# Patient Record
Sex: Female | Born: 1990 | Race: Black or African American | Hispanic: No | Marital: Married | Smoking: Never smoker
Health system: Southern US, Community
[De-identification: ages and names within clinical notes are randomized; demographics above are authoritative.]

## PROBLEM LIST (undated history)

## (undated) ENCOUNTER — Inpatient Hospital Stay (HOSPITAL_COMMUNITY): Payer: Self-pay

## (undated) DIAGNOSIS — D649 Anemia, unspecified: Secondary | ICD-10-CM

## (undated) DIAGNOSIS — I341 Nonrheumatic mitral (valve) prolapse: Secondary | ICD-10-CM

## (undated) DIAGNOSIS — J45909 Unspecified asthma, uncomplicated: Secondary | ICD-10-CM

## (undated) HISTORY — PX: NO PAST SURGERIES: SHX2092

---

## 2015-04-13 ENCOUNTER — Emergency Department (HOSPITAL_COMMUNITY)
Admission: EM | Admit: 2015-04-13 | Discharge: 2015-04-13 | Disposition: A | Discharge status: Home / Self Care | Payer: Managed Care, Other (non HMO) | Attending: Emergency Medicine | Admitting: Emergency Medicine

## 2015-04-13 ENCOUNTER — Emergency Department (HOSPITAL_COMMUNITY): Payer: Managed Care, Other (non HMO)

## 2015-04-13 ENCOUNTER — Encounter (HOSPITAL_COMMUNITY): Payer: Self-pay

## 2015-04-13 DIAGNOSIS — N8320 Unspecified ovarian cysts: Secondary | ICD-10-CM | POA: Diagnosis not present

## 2015-04-13 DIAGNOSIS — Z8679 Personal history of other diseases of the circulatory system: Secondary | ICD-10-CM | POA: Insufficient documentation

## 2015-04-13 DIAGNOSIS — R1030 Lower abdominal pain, unspecified: Secondary | ICD-10-CM | POA: Diagnosis present

## 2015-04-13 DIAGNOSIS — Z862 Personal history of diseases of the blood and blood-forming organs and certain disorders involving the immune mechanism: Secondary | ICD-10-CM | POA: Insufficient documentation

## 2015-04-13 DIAGNOSIS — Z3202 Encounter for pregnancy test, result negative: Secondary | ICD-10-CM | POA: Diagnosis not present

## 2015-04-13 DIAGNOSIS — R109 Unspecified abdominal pain: Secondary | ICD-10-CM

## 2015-04-13 DIAGNOSIS — N83202 Unspecified ovarian cyst, left side: Secondary | ICD-10-CM

## 2015-04-13 HISTORY — DX: Nonrheumatic mitral (valve) prolapse: I34.1

## 2015-04-13 HISTORY — DX: Anemia, unspecified: D64.9

## 2015-04-13 LAB — URINALYSIS, ROUTINE W REFLEX MICROSCOPIC
Bilirubin Urine: NEGATIVE
Glucose, UA: NEGATIVE mg/dL
Hgb urine dipstick: NEGATIVE
Ketones, ur: NEGATIVE mg/dL
Leukocytes, UA: NEGATIVE
Nitrite: NEGATIVE
Protein, ur: NEGATIVE mg/dL
Specific Gravity, Urine: 1.024 (ref 1.005–1.030)
Urobilinogen, UA: 1 mg/dL (ref 0.0–1.0)
pH: 6.5 (ref 5.0–8.0)

## 2015-04-13 LAB — WET PREP, GENITAL
Clue Cells Wet Prep HPF POC: NONE SEEN
Trich, Wet Prep: NONE SEEN
Yeast Wet Prep HPF POC: NONE SEEN

## 2015-04-13 LAB — POC URINE PREG, ED: Preg Test, Ur: NEGATIVE

## 2015-04-13 MED ORDER — OXYCODONE-ACETAMINOPHEN 5-325 MG PO TABS: 1.0000 | ORAL_TABLET | ORAL | Status: DC | PRN

## 2015-04-13 MED ORDER — ONDANSETRON HCL 4 MG PO TABS: 4.0000 mg | ORAL_TABLET | Freq: Four times a day (QID) | ORAL | Status: DC

## 2015-04-13 MED ORDER — OXYCODONE-ACETAMINOPHEN 5-325 MG PO TABS
1.0000 | ORAL_TABLET | Freq: Once | ORAL | Status: AC
Start: 2015-04-13 — End: 2015-04-13
  Administered 2015-04-13: 1 via ORAL
  Filled 2015-04-13: qty 1

## 2015-04-13 MED ORDER — ONDANSETRON 4 MG PO TBDP
4.0000 mg | ORAL_TABLET | Freq: Once | ORAL | Status: AC
  Administered 2015-04-13: 4 mg via ORAL
  Filled 2015-04-13: qty 1

## 2015-04-13 NOTE — ED Provider Notes (Signed)
Complains of lower abdominal pain left-sided greater than right gradual onset 7 days ago. She states she passed tissue per vagina 3 days ago. She's had some vaginal spotting for several days. Last noticed spotting today. No fever. Vomited 32 days ago, admits to slight nausea presently. Denies fever denies urinary symptoms on exam no distress nontoxic alert abdomen nondistended normal active bowel sounds tender over suprapubic area and bilateral lower quadrants left greater than right, no guarding no rigidity no rebound  Doug SouSam Amarii Amy, MD 04/13/15 1557

## 2015-04-13 NOTE — ED Notes (Signed)
Pt ambulating independently w/ steady gait on d/c in no acute distress, A&Ox4. D/c instructions reviewed w/ pt and family - pt and family deny any further questions or concerns at present. Rx given x2  

## 2015-04-13 NOTE — ED Notes (Signed)
[place]will get vitals upon return

## 2015-04-13 NOTE — Discharge Instructions (Signed)
Please monitor for new or worsening signs or symptoms, please return immediately if any present. Please follow-up with OB/GYN for further evaluation of IUD, and ovarian cyst.

## 2015-04-13 NOTE — ED Provider Notes (Signed)
CSN: [id number]     Arrival date & time 04/13/15  1422 History   None    Chief Complaint  Patient presents with  . Abdominal Pain  . Emesis    HPI   24 year old [id number] female presents today with lower abdominal pain. Patient reports that her last normal menstrual cycle started on 04/03/2015 and continue to 04/07/2015. She reports that this was very light and was not consistent throughout her normal cycle, it did start at the normal time. Patient reports that she's had an IUD for the past 3 years and reports that it is coming close to being expired. She reports that 7 days ago (04/09/15) she started experiencing bilateral lower quadrant pain more severe on the left. She reports the following day she passed some tissue that did not look like a blood clot. She reports she has not had any bleeding since the 23rd, with continued pain. She reports that she has baseline pain, worsens different times, not made worse by any movement or activity, slightly improved with ibuprofen therapy. Patient reports she is sexually active, using the IUD for protection, no history of STDs, denies vaginal discharge, burning, urinary symptoms, bowel symptoms. She denies headache, upper respiratory symptoms, shortness of breath, upper abdominal pain, lower extremity swelling or edema. Patient reports she recently moved to the area from [place], does not have an OB/GYN in the area.    Past Medical History  Diagnosis Date  . Mitral valve prolapse syndrome   . Anemia    History reviewed. No pertinent past surgical history. History reviewed. No pertinent family history. History  Substance Use Topics  . Smoking status: Never Smoker   . Smokeless tobacco: Never Used  . Alcohol Use: Yes     Comment: occasionally   OB History    No data available     Review of Systems  All other systems reviewed and are negative.   Allergies  Review of patient's allergies indicates no known allergies.  Home Medications    Prior to Admission medications   Medication Sig Start Date End Date Taking? Authorizing Provider  ibuprofen (ADVIL,MOTRIN) 600 MG tablet Take 600 mg by mouth every 6 (six) hours as needed for headache, mild pain or moderate pain.   Yes Historical Provider, MD  ondansetron (ZOFRAN) 4 MG tablet Take 1 tablet (4 mg total) by mouth every 6 (six) hours. 04/13/15   Eyvonne Mechanic, PA-C  oxyCODONE-acetaminophen (PERCOCET/ROXICET) 5-325 MG per tablet Take 1 tablet by mouth every 4 (four) hours as needed for severe pain. 04/13/15   Pocahontas Cohenour, PA-C   BP 103/64 mmHg  Pulse 77  Temp(Src) 97.7 F (36.5 C) (Oral)  Resp 16  Ht  (1.702 m)  Wt 127 lb (57.607 kg)  BMI 19.89 kg/m2  SpO2 100%  LMP 04/07/2015 Physical Exam  Constitutional: She is oriented to person, place, and time. She appears well-developed and well-nourished.  HENT:  Head: Normocephalic and atraumatic.  Eyes: Pupils are equal, round, and reactive to light.  Neck: Normal range of motion. Neck supple. No JVD present. No tracheal deviation present. No thyromegaly present.  Cardiovascular: Normal rate, regular rhythm, normal heart sounds and intact distal pulses.  Exam reveals no gallop and no friction rub.   No murmur heard. History of mitral regurg, none heard on exam  Pulmonary/Chest: Effort normal and breath sounds normal. No stridor. No respiratory distress. She has no wheezes. She has no rales. She exhibits no tenderness.  Abdominal: Soft. Bowel sounds are normal.  There is no hepatosplenomegaly. There is tenderness in the right lower quadrant and left lower quadrant. There is no rebound and no CVA tenderness.  Musculoskeletal: Normal range of motion.  Lymphadenopathy:    She has no cervical adenopathy.  Neurological: She is alert and oriented to person, place, and time. Coordination normal.  Skin: Skin is warm and dry.  Psychiatric: She has a normal mood and affect. Her behavior is normal. Judgment and thought content  normal.  Nursing note and vitals reviewed.   ED Course  Procedures (including critical care time) Labs Review Labs Reviewed  WET PREP, GENITAL - Abnormal; Notable for the following:    WBC, Wet Prep HPF POC FEW (*)    All other components within normal limits  URINALYSIS, ROUTINE W REFLEX MICROSCOPIC (NOT AT [facility])  RPR  HIV ANTIBODY (ROUTINE TESTING)  POC URINE PREG, ED  GC/CHLAMYDIA PROBE AMP ([place]) NOT AT [facility]    Imaging Review US Transvaginal Non-ob  04/13/2015   CLINICAL DATA:  Acute onset of pelvic pain, worse on the left. Initial encounter.  EXAM: TRANSABDOMINAL AND TRANSVAGINAL ULTRASOUND OF PELVIS  TECHNIQUE: Both transabdominal and transvaginal ultrasound examinations of the pelvis were performed. Transabdominal technique was performed for global imaging of the pelvis including uterus, ovaries, adnexal regions, and pelvic cul-de-sac. It was necessary to proceed with endovaginal exam following the transabdominal exam to visualize the endometrium and ovaries in greater detail.  COMPARISON:  None  FINDINGS: Uterus  Measurements: 6.5 x 3.0 x 4.1 cm. No fibroids or other mass visualized.  Endometrium  Thickness: 0.3 cm. The patient's intrauterine device is noted in expected position.  Right ovary  Measurements: 3.2 x 1.6 x 2.1 cm. Normal appearance/no adnexal mass.  Left ovary  Measurements: 3.3 x 2.2 x 2.9 cm. A somewhat complex 1.8 cm cyst is noted at the left ovary, with associated septations.  Other findings  Trace free fluid is seen within the pelvic cul-de-sac.  IMPRESSION: 1. Uterus unremarkable in appearance. Intrauterine device seen in expected position. 2. Somewhat complex 1.8 cm left ovarian cyst, with associated septations. Given its size and the patient's age, this is likely physiologic, though if the patient's symptoms persist, it could reflect a small hemorrhagic cyst. No evidence for ovarian torsion.   Electronically Signed   By: Roanna Raider M.D.   On: 04/13/2015  18:25   US Pelvis Complete  04/13/2015   CLINICAL DATA:  Acute onset of pelvic pain, worse on the left. Initial encounter.  EXAM: TRANSABDOMINAL AND TRANSVAGINAL ULTRASOUND OF PELVIS  TECHNIQUE: Both transabdominal and transvaginal ultrasound examinations of the pelvis were performed. Transabdominal technique was performed for global imaging of the pelvis including uterus, ovaries, adnexal regions, and pelvic cul-de-sac. It was necessary to proceed with endovaginal exam following the transabdominal exam to visualize the endometrium and ovaries in greater detail.  COMPARISON:  None  FINDINGS: Uterus  Measurements: 6.5 x 3.0 x 4.1 cm. No fibroids or other mass visualized.  Endometrium  Thickness: 0.3 cm. The patient's intrauterine device is noted in expected position.  Right ovary  Measurements: 3.2 x 1.6 x 2.1 cm. Normal appearance/no adnexal mass.  Left ovary  Measurements: 3.3 x 2.2 x 2.9 cm. A somewhat complex 1.8 cm cyst is noted at the left ovary, with associated septations.  Other findings  Trace free fluid is seen within the pelvic cul-de-sac.  IMPRESSION: 1. Uterus unremarkable in appearance. Intrauterine device seen in expected position. 2. Somewhat complex 1.8 cm left ovarian cyst, with  associated septations. Given its size and the patient's age, this is likely physiologic, though if the patient's symptoms persist, it could reflect a small hemorrhagic cyst. No evidence for ovarian torsion.   Electronically Signed   By: Roanna RaiderJeffery  Chang M.D.   On: 04/13/2015 18:25     EKG Interpretation None      MDM   Final diagnoses:  Cyst of left ovary    Labs: Urine, wet prep, urine pregnant, RPR, HIV- no significant findings  Imaging: US Pelvis Complete   Consults: none  Therapeutics: Percocet, Zofran  Assessment: Left ovarian cyst  Plan: Pt presents with LLQ pain and vaginal spotting. Negative preg test today, no bleeding noted during pelvic exam. IUD in expected position. Pt was found to have  ovarian cyst on US, no signs of torsion or abscess. Unlikely to be GI related.  Pt's pain managed here, discharged home with adequate pain control, instructed to follow up with OBGYN for further evaluation and management. Pt given strict return precautions in the event new or worsening s/s present. She verbalized her understanding and agreement to today's plan.  Eyvonne MechanicJeffrey Camillia Marcy, PA-C 04/15/15 1238  Doug SouSam Jacubowitz, MD 04/15/15 [phone]

## 2015-04-13 NOTE — ED Notes (Signed)
Patient c/o bilateral lower abdominal pain x 4 days. Patieant denies any vaginal discharge or dysuria.

## 2015-04-14 LAB — RPR: RPR Ser Ql: NONREACTIVE

## 2015-04-14 LAB — HIV ANTIBODY (ROUTINE TESTING W REFLEX): HIV Screen 4th Generation wRfx: NONREACTIVE

## 2015-04-15 LAB — GC/CHLAMYDIA PROBE AMP (~~LOC~~) NOT AT ARMC
Chlamydia: NEGATIVE
Neisseria Gonorrhea: NEGATIVE

## 2015-12-13 IMAGING — US US TRANSVAGINAL NON-OB
1 series · 13 of 25 positions shown · non-contrast
Comparison: None

CLINICAL DATA: Acute onset of pelvic pain, worse on the left.
Initial encounter.

EXAM:
TRANSABDOMINAL AND TRANSVAGINAL ULTRASOUND OF PELVIS
TECHNIQUE: Both transabdominal and transvaginal ultrasound examinations of the
pelvis were performed. Transabdominal technique was performed for
global imaging of the pelvis including uterus, ovaries, adnexal
regions, and pelvic cul-de-sac. It was necessary to proceed with
endovaginal exam following the transabdominal exam to visualize the
endometrium and ovaries in greater detail.

[Series 1: us transvaginal non-ob · 0.20mm/px · 52 acquisitions, 13 frames shown]
[im 1/52]
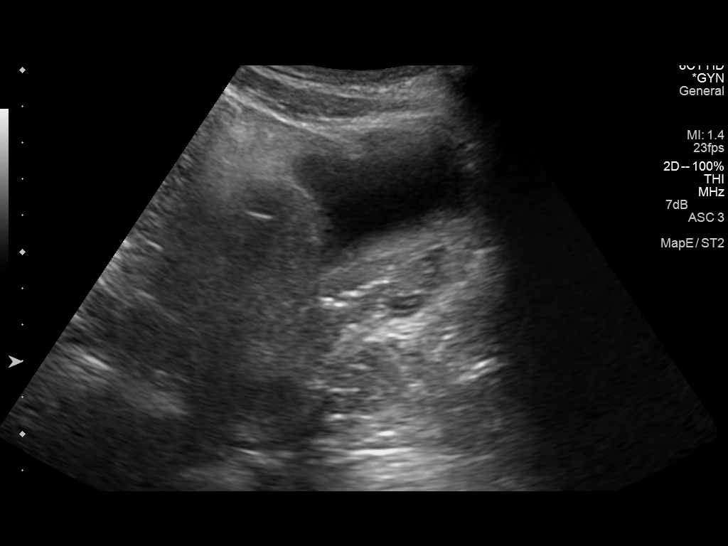
[im 5/52]
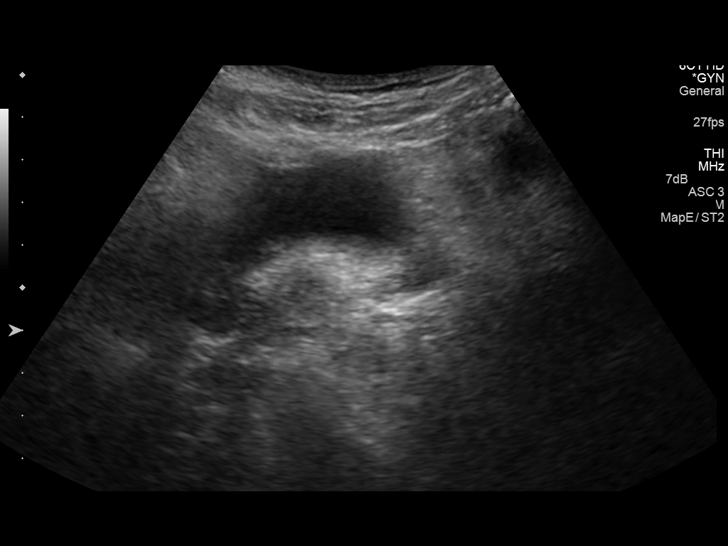
[im 9/52]
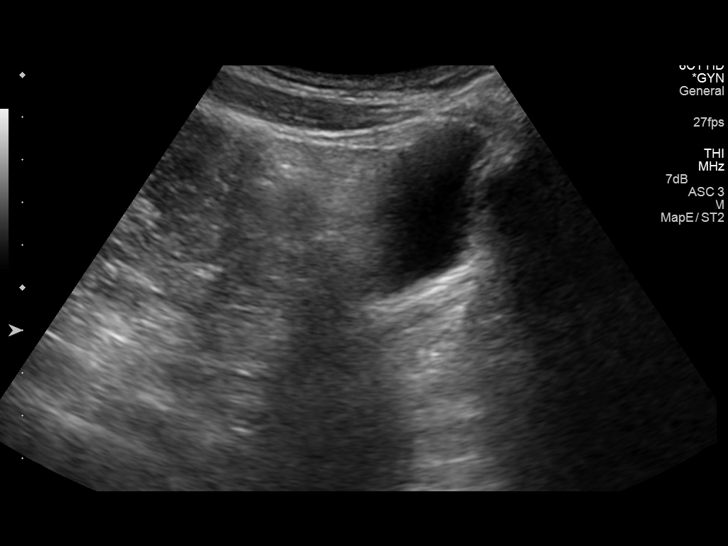
[im 13/52]
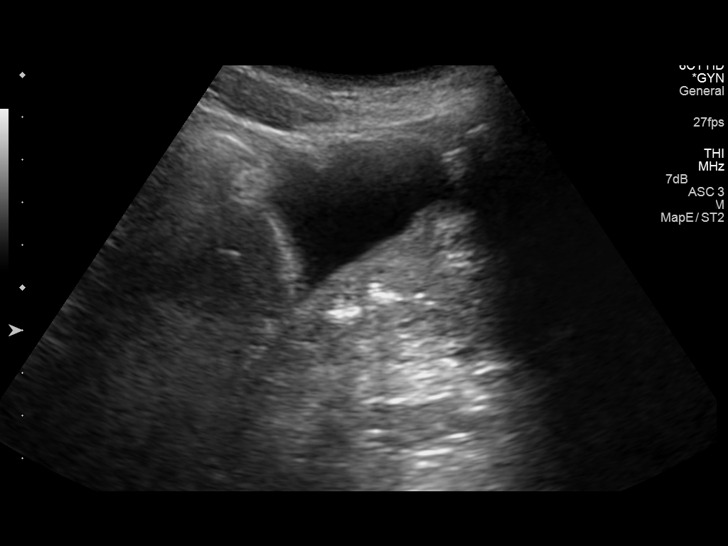
[im 18/52]
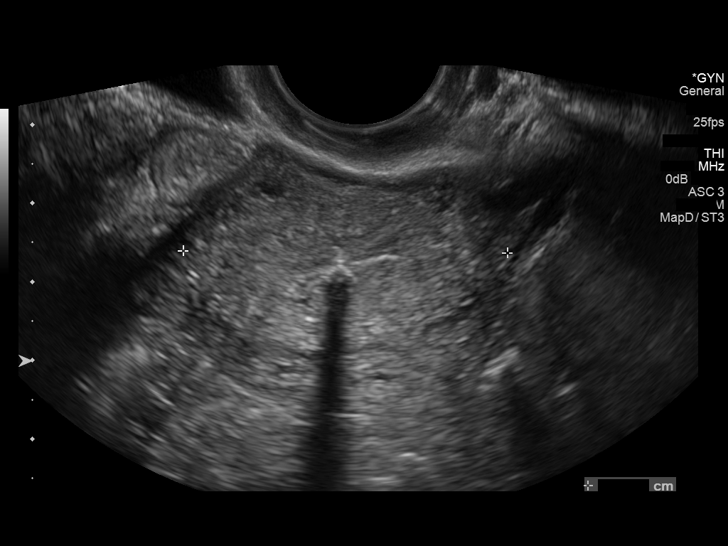
[im 22/52]
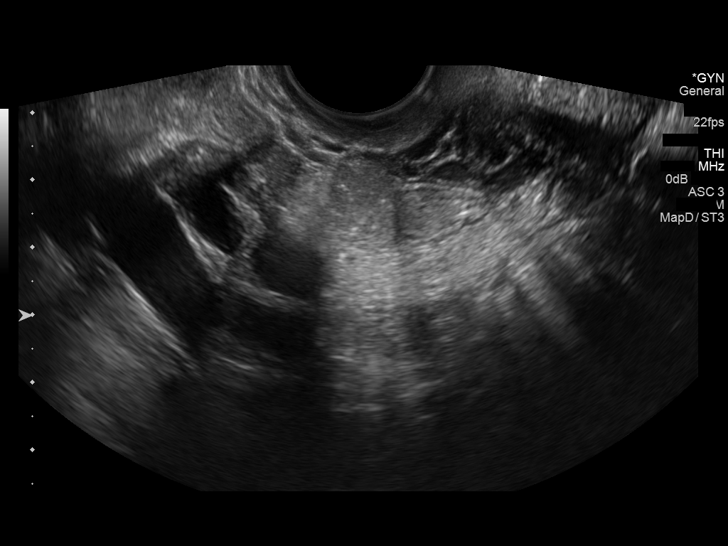
[im 26/52]
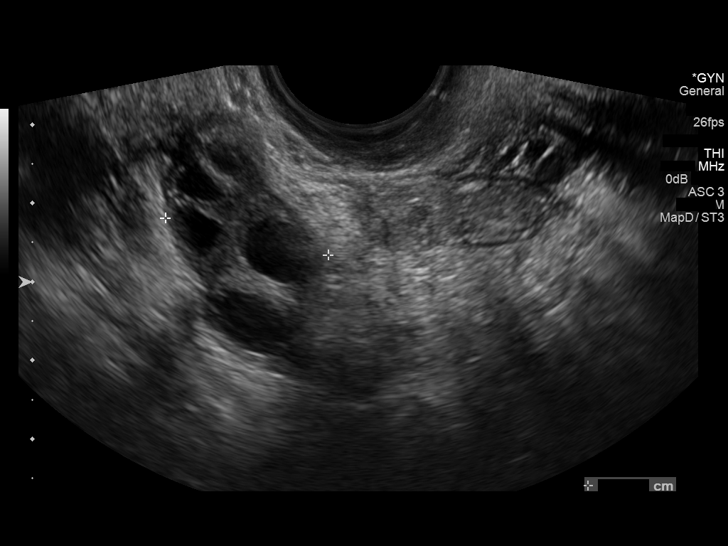
[im 30/52]
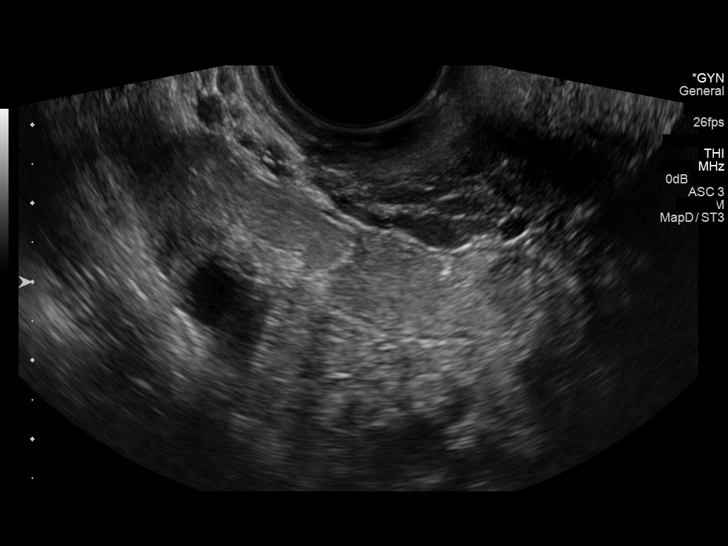
[im 35/52]
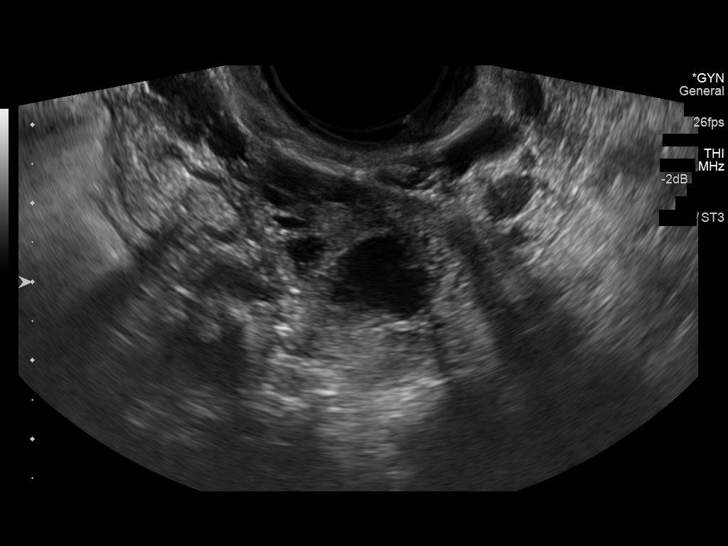
[im 39/52]
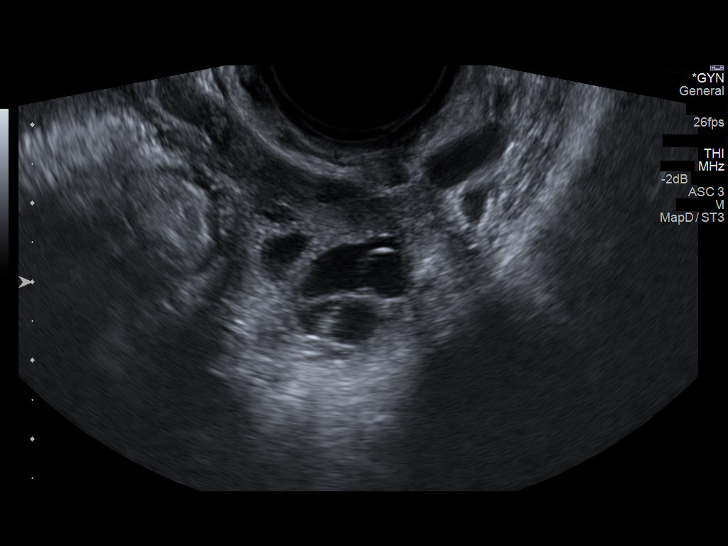
[im 43/52]
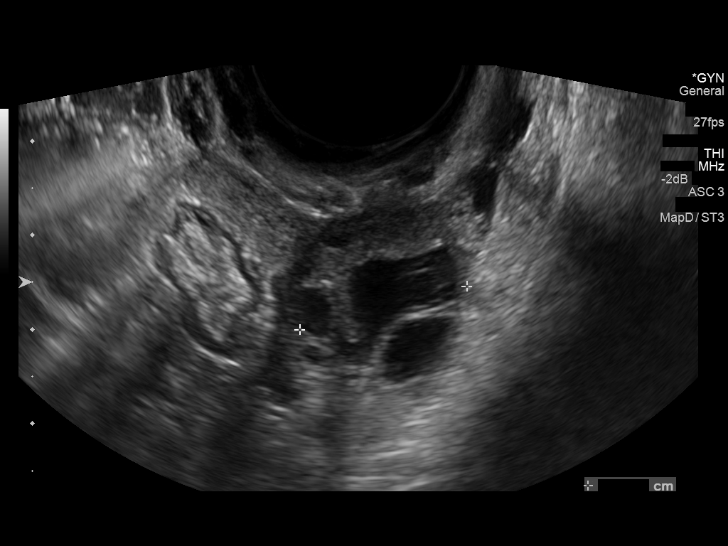
[im 47/52]
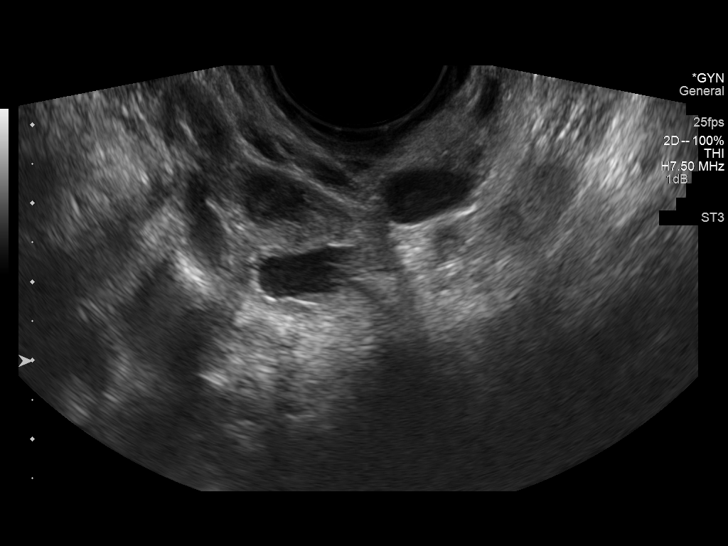
[im 52/52]
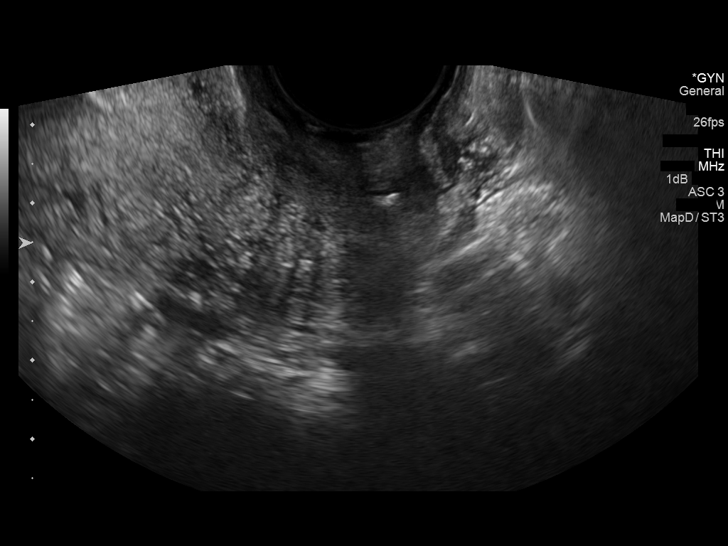

[13 of 25 positions shown; findings below may reference images not displayed]

FINDINGS: Uterus

Measurements: 6.5 x 3.0 x 4.1 cm. No fibroids or other mass
visualized.

Endometrium

Thickness: 0.3 cm. The patient's intrauterine device is noted in
expected position.

Right ovary

Measurements: 3.2 x 1.6 x 2.1 cm. Normal appearance/no adnexal mass.

Left ovary

Measurements: 3.3 x 2.2 x 2.9 cm. A somewhat complex 1.8 cm cyst is
noted at the left ovary, with associated septations.

Other findings

Trace free fluid is seen within the pelvic cul-de-sac.
IMPRESSION: 1. Uterus unremarkable in appearance. Intrauterine device seen in
expected position.
2. Somewhat complex 1.8 cm left ovarian cyst, with associated
septations. Given its size and the patient's age, this is likely
physiologic, though if the patient's symptoms persist, it could
reflect a small hemorrhagic cyst. No evidence for ovarian torsion.

## 2018-11-15 NOTE — L&D Delivery Note (Signed)
Patient was C/C/2 and pushed for approx 1hour without epidural, contractions were every 10 mins for about 19min    NSVD female infant, Apgars 9/9, weight pending.   The patient had 2nd degree laceration repaired with 2-0 vicryl. Fundus was boggy, vigorous massage of fundus and evacuation of clot was performed and bleeding remained more than expected.  Methergine 0.2mg  x1 given IM.  Cytotec 1056mcg placed rectally.  Bleeding improved and uterus became firm. EBL was approx 600, pending official calculation. Placenta was delivered intact. Vagina was clear.  Delayed cord clamping done for 30-60 seconds while warming baby. Baby was vigorous and doing skin to skin with mother.  Allyn Kenner

## 2019-01-29 ENCOUNTER — Inpatient Hospital Stay (HOSPITAL_COMMUNITY): Payer: Commercial Managed Care - PPO

## 2019-01-29 ENCOUNTER — Inpatient Hospital Stay (HOSPITAL_COMMUNITY)
Admission: AD | Admit: 2019-01-29 | Discharge: 2019-01-29 | Disposition: A | Discharge status: Home / Self Care | Payer: Commercial Managed Care - PPO | Attending: Obstetrics and Gynecology | Admitting: Obstetrics and Gynecology

## 2019-01-29 ENCOUNTER — Encounter (HOSPITAL_COMMUNITY): Payer: Self-pay | Admitting: *Deleted

## 2019-01-29 ENCOUNTER — Other Ambulatory Visit: Payer: Self-pay

## 2019-01-29 DIAGNOSIS — R109 Unspecified abdominal pain: Secondary | ICD-10-CM | POA: Diagnosis not present

## 2019-01-29 DIAGNOSIS — O209 Hemorrhage in early pregnancy, unspecified: Secondary | ICD-10-CM | POA: Diagnosis present

## 2019-01-29 DIAGNOSIS — O26891 Other specified pregnancy related conditions, first trimester: Secondary | ICD-10-CM

## 2019-01-29 DIAGNOSIS — Z3A12 12 weeks gestation of pregnancy: Secondary | ICD-10-CM | POA: Insufficient documentation

## 2019-01-29 DIAGNOSIS — O4441 Low lying placenta NOS or without hemorrhage, first trimester: Secondary | ICD-10-CM | POA: Insufficient documentation

## 2019-01-29 DIAGNOSIS — Z3491 Encounter for supervision of normal pregnancy, unspecified, first trimester: Secondary | ICD-10-CM

## 2019-01-29 HISTORY — DX: Unspecified asthma, uncomplicated: J45.909

## 2019-01-29 LAB — WET PREP, GENITAL
CLUE CELLS WET PREP: NONE SEEN
Sperm: NONE SEEN
Trich, Wet Prep: NONE SEEN
Yeast Wet Prep HPF POC: NONE SEEN

## 2019-01-29 LAB — CBC WITH DIFFERENTIAL/PLATELET
ABS IMMATURE GRANULOCYTES: 0.02 10*3/uL (ref 0.00–0.07)
Basophils Absolute: 0 10*3/uL (ref 0.0–0.1)
Basophils Relative: 0 %
EOS PCT: 1 %
Eosinophils Absolute: 0.1 10*3/uL (ref 0.0–0.5)
HEMATOCRIT: 34.9 % — AB (ref 36.0–46.0)
Hemoglobin: 11.8 g/dL — ABNORMAL LOW (ref 12.0–15.0)
Immature Granulocytes: 0 %
Lymphocytes Relative: 17 %
Lymphs Abs: 1.1 10*3/uL (ref 0.7–4.0)
MCH: 29.3 pg (ref 26.0–34.0)
MCHC: 33.8 g/dL (ref 30.0–36.0)
MCV: 86.6 fL (ref 80.0–100.0)
MONO ABS: 0.4 10*3/uL (ref 0.1–1.0)
Monocytes Relative: 7 %
Neutro Abs: 4.9 10*3/uL (ref 1.7–7.7)
Neutrophils Relative %: 75 %
Platelets: 262 10*3/uL (ref 150–400)
RBC: 4.03 MIL/uL (ref 3.87–5.11)
RDW: 14 % (ref 11.5–15.5)
WBC: 6.6 10*3/uL (ref 4.0–10.5)
nRBC: 0 % (ref 0.0–0.2)

## 2019-01-29 LAB — URINALYSIS, ROUTINE W REFLEX MICROSCOPIC
Bilirubin Urine: NEGATIVE
Glucose, UA: NEGATIVE mg/dL
Ketones, ur: 20 mg/dL — AB
Leukocytes,Ua: NEGATIVE
Nitrite: NEGATIVE
PH: 6 (ref 5.0–8.0)
Protein, ur: NEGATIVE mg/dL
SPECIFIC GRAVITY, URINE: 1.021 (ref 1.005–1.030)

## 2019-01-29 LAB — ABO/RH: ABO/RH(D): O POS

## 2019-01-29 LAB — HCG, QUANTITATIVE, PREGNANCY: HCG, BETA CHAIN, QUANT, S: 147149 m[IU]/mL — AB (ref <5)

## 2019-01-29 NOTE — Discharge Instructions (Signed)
Vaginal Bleeding During Pregnancy, First Trimester    A small amount of bleeding (spotting) from the vagina is common during early pregnancy. Sometimes the bleeding is normal and does not cause problems. At other times, though, bleeding may be a sign of something serious. Tell your doctor about any bleeding from your vagina right away.  Follow these instructions at home:  Activity  · Follow your doctor's instructions about how active you can be.  · If needed, make plans for someone to help with your normal activities.  · Do not have sex or orgasms until your doctor says that this is safe.  General instructions  · Take over-the-counter and prescription medicines only as told by your doctor.  · Watch your condition for any changes.  · Write down:  ? The number of pads you use each day.  ? How often you change pads.  ? How soaked (saturated) your pads are.  · Do not use tampons.  · Do not douche.  · If you pass any tissue from your vagina, save it to show to your doctor.  · Keep all follow-up visits as told by your doctor. This is important.  Contact a doctor if:  · You have vaginal bleeding at any time while you are pregnant.  · You have cramps.  · You have a fever.  Get help right away if:  · You have very bad cramps in your back or belly (abdomen).  · You pass large clots or a lot of tissue from your vagina.  · Your bleeding gets worse.  · You feel light-headed.  · You feel weak.  · You pass out (faint).  · You have chills.  · You are leaking fluid from your vagina.  · You have a gush of fluid from your vagina.  Summary  · Sometimes vaginal bleeding during pregnancy is normal and does not cause problems. At other times, bleeding may be a sign of something serious.  · Tell your doctor about any bleeding from your vagina right away.  · Follow your doctor's instructions about how active you can be. You may need someone to help you with your normal activities.  This information is not intended to replace advice given to  you by your health care provider. Make sure you discuss any questions you have with your health care provider.  Document Released: 03/18/2014 Document Revised: 02/02/2017 Document Reviewed: 02/02/2017  Elsevier Interactive Patient Education © 2019 [employer]

## 2019-01-29 NOTE — MAU Provider Note (Signed)
History     CSN: [id number]  Arrival date and time: 01/29/19 1300   First Provider Initiated Contact with Patient 01/29/19 1344      Chief Complaint  Patient presents with  . Vaginal Bleeding   HPI Dana Pratt is a 28 y.o. G1P0 at [redacted]w[redacted]d who presents to MAU with chief complaint of sharp abdominal pain followed by heavy vaginal bleeding. These are new problems, onset today. Patient  Endorses one episode of "really bad" abdominal cramping at 10am today followed by heavy vaginal bleeding which began around 1130am and has continued since that time. She denies sexual intercourse, abnormal vaginal discharge, fever, falls, or recent illness.  She is s.p dating ultrasound and reports "they told us everything looked good".   OB History    Gravida  1   Para      Term      Preterm      AB      Living        SAB      TAB      Ectopic      Multiple      Live Births              Past Medical History:  Diagnosis Date  . Anemia   . Asthma   . Mitral valve prolapse syndrome     Past Surgical History:  Procedure Laterality Date  . NO PAST SURGERIES      No family history on file.  Social History   Tobacco Use  . Smoking status: Never Smoker  . Smokeless tobacco: Never Used  Substance Use Topics  . Alcohol use: Not Currently    Comment: occasionally  . Drug use: No    Allergies: No Known Allergies  Medications Prior to Admission  Medication Sig Dispense Refill Last Dose  . ibuprofen (ADVIL,MOTRIN) 600 MG tablet Take 600 mg by mouth every 6 (six) hours as needed for headache, mild pain or moderate pain.   04/12/2015 at 2200  . ondansetron (ZOFRAN) 4 MG tablet Take 1 tablet (4 mg total) by mouth every 6 (six) hours. 12 tablet 0   . oxyCODONE-acetaminophen (PERCOCET/ROXICET) 5-325 MG per tablet Take 1 tablet by mouth every 4 (four) hours as needed for severe pain. 15 tablet 0     Review of Systems  Constitutional: Negative for chills, fatigue and fever.   Respiratory: Negative for shortness of breath.   Gastrointestinal: Positive for abdominal pain. Negative for nausea and vomiting.  Genitourinary: Positive for vaginal bleeding. Negative for difficulty urinating, dyspareunia, dysuria, vaginal discharge and vaginal pain.  Neurological: Negative for syncope, weakness and headaches.  All other systems reviewed and are negative.  Physical Exam   Blood pressure 126/85, pulse 85, temperature 98.4 F (36.9 C), resp. rate 16, height 5\' 7"  (1.702 m), weight 71.7 kg, last menstrual period 10/31/2018, SpO2 100 %.  Physical Exam  Nursing note and vitals reviewed. Constitutional: She is oriented to person, place, and time. She appears well-developed and well-nourished.  Cardiovascular: Normal rate, normal heart sounds and intact distal pulses.  Respiratory: Effort normal.  GI: Soft. She exhibits no distension. There is no abdominal tenderness. There is no rebound, no guarding and no CVA tenderness.  Genitourinary:    Genitourinary Comments: Small amount dark red blood removed with fox swab x 1 No new bleeding   Musculoskeletal: Normal range of motion.  Neurological: She is alert and oriented to person, place, and time.  Skin: Skin is warm and dry.  Psychiatric: She has a normal mood and affect. Her behavior is normal. Judgment and thought content normal.    MAU Course  Procedures: sterile speculum exam  Patient Vitals for the past 24 hrs:  BP Temp Pulse Resp SpO2 Height Weight  01/29/19 1610 114/75 - 91 16 - - -  01/29/19 1326 126/85 98.4 F (36.9 C) 85 16 100 % - -  01/29/19 1325 - - - - - 5\' 7"  (1.702 m) 71.7 kg    Results for orders placed or performed during the hospital encounter of 01/29/19 (from the past 24 hour(s))  Urinalysis, Routine w reflex microscopic     Status: Abnormal   Collection Time: 01/29/19  1:50 PM  Result Value Ref Range   Color, Urine YELLOW YELLOW   APPearance HAZY (A) CLEAR   Specific Gravity, Urine 1.021  1.005 - 1.030   pH 6.0 5.0 - 8.0   Glucose, UA NEGATIVE NEGATIVE mg/dL   Hgb urine dipstick SMALL (A) NEGATIVE   Bilirubin Urine NEGATIVE NEGATIVE   Ketones, ur 20 (A) NEGATIVE mg/dL   Protein, ur NEGATIVE NEGATIVE mg/dL   Nitrite NEGATIVE NEGATIVE   Leukocytes,Ua NEGATIVE NEGATIVE   RBC / HPF 11-20 0 - 5 RBC/hpf   WBC, UA 0-5 0 - 5 WBC/hpf   Bacteria, UA RARE (A) NONE SEEN   Squamous Epithelial / LPF 0-5 0 - 5   Mucus PRESENT    Amorphous Crystal PRESENT   Wet prep, genital     Status: Abnormal   Collection Time: 01/29/19  1:53 PM  Result Value Ref Range   Yeast Wet Prep HPF POC NONE SEEN NONE SEEN   Trich, Wet Prep NONE SEEN NONE SEEN   Clue Cells Wet Prep HPF POC NONE SEEN NONE SEEN   WBC, Wet Prep HPF POC MANY (A) NONE SEEN   Sperm NONE SEEN   CBC with Differential/Platelet     Status: Abnormal   Collection Time: 01/29/19  1:59 PM  Result Value Ref Range   WBC 6.6 4.0 - 10.5 K/uL   RBC 4.03 3.87 - 5.11 MIL/uL   Hemoglobin 11.8 (L) 12.0 - 15.0 g/dL   HCT 46.934.9 (L) 62.936.0 - 52.846.0 %   MCV 86.6 80.0 - 100.0 fL   MCH 29.3 26.0 - 34.0 pg   MCHC 33.8 30.0 - 36.0 g/dL   RDW 41.314.0 24.411.5 - 01.015.5 %   Platelets 262 150 - 400 K/uL   nRBC 0.0 0.0 - 0.2 %   Neutrophils Relative % 75 %   Neutro Abs 4.9 1.7 - 7.7 K/uL   Lymphocytes Relative 17 %   Lymphs Abs 1.1 0.7 - 4.0 K/uL   Monocytes Relative 7 %   Monocytes Absolute 0.4 0.1 - 1.0 K/uL   Eosinophils Relative 1 %   Eosinophils Absolute 0.1 0.0 - 0.5 K/uL   Basophils Relative 0 %   Basophils Absolute 0.0 0.0 - 0.1 K/uL   Immature Granulocytes 0 %   Abs Immature Granulocytes 0.02 0.00 - 0.07 K/uL  hCG, quantitative, pregnancy     Status: Abnormal   Collection Time: 01/29/19  1:59 PM  Result Value Ref Range   hCG, Beta Chain, Quant, S 147,149 (H) <5 mIU/mL  ABO/Rh     Status: None   Collection Time: 01/29/19  2:07 PM  Result Value Ref Range   ABO/RH(D) O POS    No rh immune globuloin      NOT A RH IMMUNE GLOBULIN CANDIDATE, PT  RH POSITIVE  Performed at [facility], [address], [place], [place] [address]     US Ob Comp Less 14 Wks  Result Date: 01/29/2019 CLINICAL DATA:  Abdominal pain and vaginal bleeding in first trimester of pregnancy EXAM: OBSTETRIC <14 WK ULTRASOUND TECHNIQUE: Transabdominal ultrasound was performed for evaluation of the gestation as well as the maternal uterus and adnexal regions. COMPARISON:  None for this gestation FINDINGS: Intrauterine gestational sac: Present, single Yolk sac:  Not visualized Embryo:  Present Cardiac Activity: Present Heart Rate: 169 bpm CRL:   70.4 mm   13 w 1 d                  [place] EDC: 08/05/2019 Subchorionic hemorrhage:  None visualized Maternal uterus/adnexae: Low lying placenta in very close proximity to internal cervical os cannot exclude previa, requires attention on follow-up imaging. RIGHT ovary normal size and morphology 4.0 x 2.1 x 2.3 cm. LEFT ovary not visualized likely due to a combination of enlarged pregnant uterus and bowel loops. No free pelvic fluid or adnexal masses. IMPRESSION: Single live intrauterine gestation at 13 weeks 1 day EGA by crown-rump length. Low lying placenta in very close proximity to internal cervical os cannot exclude placenta previa; recommend attention on follow-up imaging. Electronically Signed   By: Ulyses Southward M.D.   On: 01/29/2019 16:21     Assessment and Plan  --28 y.o. G1P0 at [redacted]w[redacted]d by LMP --Vaginal bleeding, now resolved --FHT 166 by Doppler --Low lying placenta --Discharge home in stable condition  F/U: Patient has OB intake appointment tomorrow 01/30/2019  Calvert Cantor, CNM 01/29/2019, 4:29 PM

## 2019-01-29 NOTE — MAU Note (Signed)
Pt presents to m[facility] with complaints of vaginal bleeding that started this morning. Lower abdominal cramping. Last intercourse on Thursday.

## 2019-01-30 LAB — OB RESULTS CONSOLE ABO/RH: RH Type: POSITIVE

## 2019-01-30 LAB — GC/CHLAMYDIA PROBE AMP (~~LOC~~) NOT AT ARMC
Chlamydia: NEGATIVE
NEISSERIA GONORRHEA: NEGATIVE

## 2019-01-30 LAB — OB RESULTS CONSOLE RUBELLA ANTIBODY, IGM: Rubella: IMMUNE

## 2019-01-30 LAB — OB RESULTS CONSOLE GC/CHLAMYDIA
Chlamydia: NEGATIVE
Gonorrhea: NEGATIVE

## 2019-01-30 LAB — OB RESULTS CONSOLE RPR: RPR: NONREACTIVE

## 2019-01-30 LAB — OB RESULTS CONSOLE HEPATITIS B SURFACE ANTIGEN: Hepatitis B Surface Ag: NEGATIVE

## 2019-01-30 LAB — OB RESULTS CONSOLE HIV ANTIBODY (ROUTINE TESTING): HIV: NONREACTIVE

## 2019-01-30 LAB — OB RESULTS CONSOLE ANTIBODY SCREEN: Antibody Screen: NEGATIVE

## 2019-07-12 LAB — OB RESULTS CONSOLE GBS: GBS: POSITIVE

## 2019-08-09 ENCOUNTER — Other Ambulatory Visit: Payer: Self-pay | Admitting: Obstetrics and Gynecology

## 2019-08-09 ENCOUNTER — Encounter (HOSPITAL_COMMUNITY): Payer: Self-pay | Admitting: *Deleted

## 2019-08-09 ENCOUNTER — Telehealth (HOSPITAL_COMMUNITY): Payer: Self-pay | Admitting: *Deleted

## 2019-08-09 NOTE — Telephone Encounter (Signed)
Preadmission screen  

## 2019-08-10 ENCOUNTER — Other Ambulatory Visit: Payer: Self-pay | Admitting: Obstetrics & Gynecology

## 2019-08-11 ENCOUNTER — Encounter (HOSPITAL_COMMUNITY): Payer: Self-pay

## 2019-08-11 ENCOUNTER — Other Ambulatory Visit: Payer: Self-pay

## 2019-08-11 ENCOUNTER — Inpatient Hospital Stay (HOSPITAL_COMMUNITY)
Admission: AD | Admit: 2019-08-11 | Discharge: 2019-08-13 | DRG: 807 | Disposition: A | Discharge status: Home / Self Care | Payer: Commercial Managed Care - PPO | Source: Ambulatory Visit | Attending: Obstetrics and Gynecology | Admitting: Obstetrics and Gynecology

## 2019-08-11 ENCOUNTER — Other Ambulatory Visit (HOSPITAL_COMMUNITY)
Admission: RE | Admit: 2019-08-11 | Discharge: 2019-08-11 | Disposition: A | Discharge status: Home / Self Care | Payer: Commercial Managed Care - PPO | Source: Ambulatory Visit | Attending: Obstetrics and Gynecology | Admitting: Obstetrics and Gynecology

## 2019-08-11 DIAGNOSIS — Z3A40 40 weeks gestation of pregnancy: Secondary | ICD-10-CM

## 2019-08-11 DIAGNOSIS — O99824 Streptococcus B carrier state complicating childbirth: Principal | ICD-10-CM | POA: Diagnosis present

## 2019-08-11 DIAGNOSIS — I341 Nonrheumatic mitral (valve) prolapse: Secondary | ICD-10-CM | POA: Diagnosis present

## 2019-08-11 DIAGNOSIS — O9942 Diseases of the circulatory system complicating childbirth: Secondary | ICD-10-CM | POA: Diagnosis present

## 2019-08-11 DIAGNOSIS — O26893 Other specified pregnancy related conditions, third trimester: Secondary | ICD-10-CM | POA: Diagnosis present

## 2019-08-11 DIAGNOSIS — Z20828 Contact with and (suspected) exposure to other viral communicable diseases: Secondary | ICD-10-CM | POA: Diagnosis present

## 2019-08-11 LAB — TYPE AND SCREEN
ABO/RH(D): O POS
Antibody Screen: NEGATIVE

## 2019-08-11 LAB — CBC
HCT: 43.6 % (ref 36.0–46.0)
Hemoglobin: 15.2 g/dL — ABNORMAL HIGH (ref 12.0–15.0)
MCH: 32.1 pg (ref 26.0–34.0)
MCHC: 34.9 g/dL (ref 30.0–36.0)
MCV: 92 fL (ref 80.0–100.0)
Platelets: 189 10*3/uL (ref 150–400)
RBC: 4.74 MIL/uL (ref 3.87–5.11)
RDW: 14.2 % (ref 11.5–15.5)
WBC: 8.4 10*3/uL (ref 4.0–10.5)
nRBC: 0 % (ref 0.0–0.2)

## 2019-08-11 LAB — SARS CORONAVIRUS 2 (TAT 6-24 HRS): SARS Coronavirus 2: NEGATIVE

## 2019-08-11 LAB — POCT FERN TEST: POCT Fern Test: POSITIVE

## 2019-08-11 MED ORDER — LACTATED RINGERS IV SOLN: 500.0000 mL | INTRAVENOUS | Status: DC | PRN

## 2019-08-11 MED ORDER — OXYTOCIN BOLUS FROM INFUSION
500.0000 mL | Freq: Once | INTRAVENOUS | Status: AC
  Administered 2019-08-11: 22:00:00 500 mL via INTRAVENOUS

## 2019-08-11 MED ORDER — ONDANSETRON HCL 4 MG/2ML IJ SOLN: 4.0000 mg | Freq: Four times a day (QID) | INTRAMUSCULAR | Status: DC | PRN

## 2019-08-11 MED ORDER — FENTANYL CITRATE (PF) 100 MCG/2ML IJ SOLN
50.0000 ug | INTRAMUSCULAR | Status: DC | PRN
  Administered 2019-08-11: 50 ug via INTRAVENOUS

## 2019-08-11 MED ORDER — OXYTOCIN 40 UNITS IN NORMAL SALINE INFUSION - SIMPLE MED
2.5000 [IU]/h | INTRAVENOUS | Status: DC
  Filled 2019-08-11: qty 1000

## 2019-08-11 MED ORDER — METHYLERGONOVINE MALEATE 0.2 MG/ML IJ SOLN
0.2000 mg | Freq: Once | INTRAMUSCULAR | Status: AC
  Administered 2019-08-11: 0.2 mg via INTRAMUSCULAR

## 2019-08-11 MED ORDER — SOD CITRATE-CITRIC ACID 500-334 MG/5ML PO SOLN: 30.0000 mL | ORAL | Status: DC | PRN

## 2019-08-11 MED ORDER — METHYLERGONOVINE MALEATE 0.2 MG/ML IJ SOLN
INTRAMUSCULAR | Status: AC
  Filled 2019-08-11: qty 1

## 2019-08-11 MED ORDER — ACETAMINOPHEN 325 MG PO TABS: 650.0000 mg | ORAL_TABLET | ORAL | Status: DC | PRN

## 2019-08-11 MED ORDER — OXYCODONE-ACETAMINOPHEN 5-325 MG PO TABS: 1.0000 | ORAL_TABLET | ORAL | Status: DC | PRN

## 2019-08-11 MED ORDER — PENICILLIN G 3 MILLION UNITS IVPB - SIMPLE MED: 3.0000 10*6.[IU] | INTRAVENOUS | Status: DC

## 2019-08-11 MED ORDER — SODIUM CHLORIDE 0.9 % IV SOLN
5.0000 10*6.[IU] | Freq: Once | INTRAVENOUS | Status: AC
  Administered 2019-08-11: 5 10*6.[IU] via INTRAVENOUS
  Filled 2019-08-11: qty 5

## 2019-08-11 MED ORDER — LACTATED RINGERS IV SOLN
INTRAVENOUS | Status: DC
  Administered 2019-08-11: 20:00:00 via INTRAVENOUS

## 2019-08-11 MED ORDER — FENTANYL CITRATE (PF) 100 MCG/2ML IJ SOLN
INTRAMUSCULAR | Status: AC
  Filled 2019-08-11: qty 2

## 2019-08-11 MED ORDER — MISOPROSTOL 200 MCG PO TABS
ORAL_TABLET | ORAL | Status: AC
  Administered 2019-08-11: 1000 ug
  Filled 2019-08-11: qty 5

## 2019-08-11 MED ORDER — LIDOCAINE HCL (PF) 1 % IJ SOLN
30.0000 mL | INTRAMUSCULAR | Status: DC | PRN
  Administered 2019-08-11: 30 mL via SUBCUTANEOUS
  Filled 2019-08-11: qty 30

## 2019-08-11 MED ORDER — FLEET ENEMA 7-19 GM/118ML RE ENEM: 1.0000 | ENEMA | RECTAL | Status: DC | PRN

## 2019-08-11 MED ORDER — OXYCODONE-ACETAMINOPHEN 5-325 MG PO TABS
2.0000 | ORAL_TABLET | ORAL | Status: DC | PRN
  Administered 2019-08-12: 2 via ORAL
  Filled 2019-08-11: qty 2

## 2019-08-11 MED ORDER — MISOPROSTOL 200 MCG PO TABS: 1000.0000 ug | ORAL_TABLET | Freq: Once | ORAL | Status: DC

## 2019-08-11 NOTE — MAU Note (Signed)
Pt reports water broke at 1800, pink fluid. Contractions after water broke. Cervix 1cm on last exam. Reports good fetal movement.

## 2019-08-11 NOTE — MAU Note (Signed)
Pt here for PAT covid swab, denies symptoms. Swab collected. 

## 2019-08-11 NOTE — H&P (Signed)
28 y.o. [redacted]w[redacted]d  G1P0 comes in c/o leaking fluid and ctx.  Otherwise has good fetal movement and no bleeding, but pink tinge of fluid.  Past Medical History:  Diagnosis Date  . Anemia   . Asthma   . Mitral valve prolapse syndrome     Past Surgical History:  Procedure Laterality Date  . NO PAST SURGERIES      OB History  Gravida Para Term Preterm AB Living  1            SAB TAB Ectopic Multiple Live Births               # Outcome Date GA Lbr Len/2nd Weight Sex Delivery Anes PTL Lv  1 Current             Social History   Socioeconomic History  . Marital status: Married    Spouse name: Not on file  . Number of children: Not on file  . Years of education: Not on file  . Highest education level: Not on file  Occupational History  . Not on file  Social Needs  . Financial resource strain: Not hard at all  . Food insecurity    Worry: Never true    Inability: Never true  . Transportation needs    Medical: No    Non-medical: No  Tobacco Use  . Smoking status: Never Smoker  . Smokeless tobacco: Never Used  Substance and Sexual Activity  . Alcohol use: Not Currently    Comment: occasionally  . Drug use: No  . Sexual activity: Yes  Lifestyle  . Physical activity    Days per week: Not on file    Minutes per session: Not on file  . Stress: Not on file  Relationships  . Social Herbalist on phone: Not on file    Gets together: Not on file    Attends religious service: Not on file    Active member of club or organization: Not on file    Attends meetings of clubs or organizations: Not on file    Relationship status: Not on file  . Intimate partner violence    Fear of current or ex partner: Not on file    Emotionally abused: Not on file    Physically abused: Not on file    Forced sexual activity: Not on file  Other Topics Concern  . Not on file  Social History Narrative  . Not on file   Patient has no known allergies.    Prenatal Transfer Tool  Maternal  Diabetes: No Genetic Screening: Declined Maternal Ultrasounds/Referrals: Normal Fetal Ultrasounds or other Referrals:  None Maternal Substance Abuse:  No Significant Maternal Medications:  None Significant Maternal Lab Results: Group B Strep positive  Other PNC: uncomplicated.    Vitals:   08/11/19 1945  BP: 122/83  Pulse: (!) 109  Resp: 18  Temp: 97.7 F (36.5 C)  TempSrc: Oral  SpO2: 100%    Lungs/Cor:  NAD Abdomen:  soft, gravid Ex:  no cords, erythema SVE: 5.5/70/-2 per MAU FHTs:  135 mod var, not on monitor long yet Toco:  irregular tracing   A/P   Admit with [facility] at term  GBS pos - PCN for GBS  Pt does not desire epidural, but ok if she changes her mind  Other routine care  Allyn Kenner

## 2019-08-12 LAB — CBC
HCT: 37.2 % (ref 36.0–46.0)
Hemoglobin: 12.5 g/dL (ref 12.0–15.0)
MCH: 31.2 pg (ref 26.0–34.0)
MCHC: 33.6 g/dL (ref 30.0–36.0)
MCV: 92.8 fL (ref 80.0–100.0)
Platelets: 193 10*3/uL (ref 150–400)
RBC: 4.01 MIL/uL (ref 3.87–5.11)
RDW: 14.2 % (ref 11.5–15.5)
WBC: 18.8 10*3/uL — ABNORMAL HIGH (ref 4.0–10.5)
nRBC: 0 % (ref 0.0–0.2)

## 2019-08-12 MED ORDER — COCONUT OIL OIL: 1.0000 "application " | TOPICAL_OIL | Status: DC | PRN

## 2019-08-12 MED ORDER — OXYCODONE-ACETAMINOPHEN 5-325 MG PO TABS: 2.0000 | ORAL_TABLET | ORAL | Status: DC | PRN

## 2019-08-12 MED ORDER — BENZOCAINE-MENTHOL 20-0.5 % EX AERO
1.0000 "application " | INHALATION_SPRAY | CUTANEOUS | Status: DC | PRN
  Administered 2019-08-12: 1 via TOPICAL
  Filled 2019-08-12 (×2): qty 56

## 2019-08-12 MED ORDER — SIMETHICONE 80 MG PO CHEW: 80.0000 mg | CHEWABLE_TABLET | ORAL | Status: DC | PRN

## 2019-08-12 MED ORDER — TETANUS-DIPHTH-ACELL PERTUSSIS 5-2.5-18.5 LF-MCG/0.5 IM SUSP: 0.5000 mL | Freq: Once | INTRAMUSCULAR | Status: DC

## 2019-08-12 MED ORDER — PRENATAL MULTIVITAMIN CH
1.0000 | ORAL_TABLET | Freq: Every day | ORAL | Status: DC
  Administered 2019-08-12 – 2019-08-13 (×2): 1 via ORAL
  Filled 2019-08-12 (×2): qty 1

## 2019-08-12 MED ORDER — WITCH HAZEL-GLYCERIN EX PADS: 1.0000 "application " | MEDICATED_PAD | CUTANEOUS | Status: DC | PRN

## 2019-08-12 MED ORDER — ONDANSETRON HCL 4 MG/2ML IJ SOLN: 4.0000 mg | INTRAMUSCULAR | Status: DC | PRN

## 2019-08-12 MED ORDER — OXYCODONE-ACETAMINOPHEN 5-325 MG PO TABS: 1.0000 | ORAL_TABLET | ORAL | Status: DC | PRN

## 2019-08-12 MED ORDER — DIBUCAINE (PERIANAL) 1 % EX OINT: 1.0000 "application " | TOPICAL_OINTMENT | CUTANEOUS | Status: DC | PRN

## 2019-08-12 MED ORDER — IBUPROFEN 600 MG PO TABS
600.0000 mg | ORAL_TABLET | Freq: Four times a day (QID) | ORAL | Status: DC
  Administered 2019-08-12 – 2019-08-13 (×6): 600 mg via ORAL
  Filled 2019-08-12 (×6): qty 1

## 2019-08-12 MED ORDER — SENNOSIDES-DOCUSATE SODIUM 8.6-50 MG PO TABS
2.0000 | ORAL_TABLET | ORAL | Status: DC
  Administered 2019-08-12: 2 via ORAL
  Filled 2019-08-12: qty 2

## 2019-08-12 MED ORDER — ONDANSETRON HCL 4 MG PO TABS: 4.0000 mg | ORAL_TABLET | ORAL | Status: DC | PRN

## 2019-08-12 MED ORDER — ZOLPIDEM TARTRATE 5 MG PO TABS: 5.0000 mg | ORAL_TABLET | Freq: Every evening | ORAL | Status: DC | PRN

## 2019-08-12 MED ORDER — ACETAMINOPHEN 325 MG PO TABS
650.0000 mg | ORAL_TABLET | ORAL | Status: DC | PRN
  Administered 2019-08-12: 650 mg via ORAL
  Filled 2019-08-12: qty 2

## 2019-08-12 MED ORDER — DIPHENHYDRAMINE HCL 25 MG PO CAPS: 25.0000 mg | ORAL_CAPSULE | Freq: Four times a day (QID) | ORAL | Status: DC | PRN

## 2019-08-12 NOTE — Lactation Note (Signed)
This note was copied from a baby's chart. Lactation Consultation Note  Patient Name: Dana Pratt [id number] Date: 08/12/2019 Reason for consult: Term;Primapara;1st time breastfeeding  P1 mother whose infant is now 50 hours old.  Initiated conversation with parents when baby started choking and gagging.   Father had already gotten the bulb syringe and mother was holding baby upright.  Baby had a large emesis of amniotic fluid from oral cavity. Talked parents through suctioning.   She continued to gag and I removed her from mother's arms and demonstrated how to burp firmly and how to stimulate baby.  She began crying.  Color was pink and well perfused.  Baby continued to burp for a couple minutes before emesis was resolved.  Praised parents for their actions and discussed the episode.  Since baby was now awake mother wanted to try latching with me present.  Baby was not showing any feeding cues at that time.  Positioned mother appropriately and attempted to latch in the cross cradle hold on the left breast.  Baby latched but immediately fell asleep. Demonstrated breast compressions and gentle stimulation, however, she continued to sleep.  Reassured parents that this is typical behavior for a baby at this age and that she is probably tired also due to her choking episode.  Suggested mother hold STS and she remained sleeping.  Mother will feed 8-12 times/24 hours or sooner if baby shows cues.  Cues reviewed.  Mother demonstrated hand expression and was able to obtain a colostrum drop.  Container provided and milk storage times reviewed.  Finger feeding demonstrated.    Mom made aware of O/P services, breastfeeding support groups, community resources, and our phone # for post-discharge questions.  Mother will work from home after discharge and has a DEBP available.  Father present and supportive.  Encouraged mother to call for latch assistance as needed.  RN updated.   Maternal Data Formula  Feeding for Exclusion: No Has patient been taught Hand Expression?: Yes Does the patient have breastfeeding experience prior to this delivery?: No  Feeding Feeding Type: Breast Fed  LATCH Score Latch: Too sleepy or reluctant, no latch achieved, no sucking elicited.  Audible Swallowing: None  Type of Nipple: Everted at rest and after stimulation(short shafted)  Comfort (Breast/Nipple): Soft / non-tender  Hold (Positioning): Assistance needed to correctly position infant at breast and maintain latch.  LATCH Score: 5  Interventions Interventions: Breast feeding basics reviewed;Assisted with latch;Skin to skin;Breast massage;Hand express;Breast compression;Position options;Support pillows;Adjust position  Lactation Tools Discussed/Used     Consult Status Consult Status: Follow-up Date: 08/12/19 Follow-up type: In-patient    Masaki Rothbauer R Merrel Crabbe 08/12/2019, 11:00 AM

## 2019-08-12 NOTE — Plan of Care (Signed)
  Problem: Clinical Measurements: Goal: Ability to maintain clinical measurements within normal limits will improve Outcome: Adequate for Discharge   Problem: Activity: Goal: Risk for activity intolerance will decrease Outcome: Adequate for Discharge   Problem: Pain Managment: Goal: General experience of comfort will improve Outcome: Adequate for Discharge   Problem: Activity: Goal: Will verbalize the importance of balancing activity with adequate rest periods Outcome: Adequate for Discharge Goal: Ability to tolerate increased activity will improve Outcome: Adequate for Discharge   Problem: Life Cycle: Goal: Chance of risk for complications during the postpartum period will decrease Outcome: Adequate for Discharge   Problem: Role Relationship: Goal: Ability to demonstrate positive interaction with newborn will improve Outcome: Adequate for Discharge

## 2019-08-12 NOTE — Progress Notes (Signed)
Patient is eating, ambulating, voiding.  Pain control is good. Appropriate lochia, no complaints.  Vitals:   08/12/19 0006 08/12/19 0100 08/12/19 0214 08/12/19 0545  BP: 132/83 (!) 129/91 110/79 104/75  Pulse: 88 85 74 87  Resp: 16 18 18 18   Temp:  98.6 F (37 C) 98.4 F (36.9 C) 98.7 F (37.1 C)  TempSrc:  Oral Oral Oral  SpO2:      Weight:      Height:        Fundus firm Abd: nontender Ext: no calf tenderness  Lab Results  Component Value Date   WBC 18.8 (H) 08/12/2019   HGB 12.5 08/12/2019   HCT 37.2 08/12/2019   MCV 92.8 08/12/2019   PLT 193 08/12/2019    --/--/O POS (09/26 1956)  A/P Post partum day 1 Doing well  Routine care.  Expect d/c 9/28.    Allyn Kenner

## 2019-08-13 ENCOUNTER — Inpatient Hospital Stay (HOSPITAL_COMMUNITY): Payer: Commercial Managed Care - PPO

## 2019-08-13 MED ORDER — IBUPROFEN 600 MG PO TABS: 600.0000 mg | ORAL_TABLET | Freq: Four times a day (QID) | ORAL | 0 refills | Status: AC

## 2019-08-13 NOTE — Progress Notes (Addendum)
CSW received consult due to score 10 on Edinburgh Depression Screen. CSW met with MOB to offer support and complete assessment.    MOB sitting up in bed with FOB present at bedside and holding infant, when CSW entered the room. CSW introduced self and received verbal permission to complete assessment with FOB present. CSW explained reason for consult to which MOB expressed understanding. MOB and FOB both welcoming of CSW visit and were very pleasant and engaged throughout visit. CSW inquired about MOB's Edinburgh Score and MOB explained she thinks things will improve once she is able to start running again and utilizing coping skills he has learned in the past. MOB acknowledged being diagnosed with anxiety and depression when she was in college but reported she didn't start taking medications until 2015. MOB reported she is not currently on medications nor does she feel like it is something she needs, at this time. MOB stated she had her days where she felt down during her pregnancy but felt it was manageable. CSW provided education regarding Baby Blues vs PMADs and provided MOB with resources for mental health follow up.  CSW encouraged MOB to evaluate her mental health throughout the postpartum period with the use of the New Mom Checklist developed by [facility] as well as the Edinburgh Postnatal Depression Scale and notify a medical professional if symptoms arise. Per chart review and after reviewing Edinburgh Score in Epic it appeared MOB had scored a "1" on question 10 regarding thoughts of self harm. CSW reviewed MOB's Edinburgh Worksheet while in the room and noted score is actually "0". MOB did not appear to be displaying any acute mental health symptoms and denied any current SI or HI. MOB reported feeling well-supported by FOB, her parents, her grandparents and her aunt and uncle.  MOB and FOB confirmed having all essential items for infant once discharged and reported infant would be sleeping  in a bassinet once home. CSW provided review of Sudden Infant Death Syndrome (SIDS) precautions and safe sleeping habits.    CSW identifies no further need for intervention and no barriers to discharge at this time.  Dana Pratt, LCSWA  [facility] [phone]   

## 2019-08-13 NOTE — Lactation Note (Addendum)
This note was copied from a baby's chart. Lactation Consultation Note  Patient Name: Dana Pratt [id number] Date: 08/13/2019 Reason for consult: Follow-up assessment   Maternal Data  P1, 26 hour female infant, LGA greater than 9 lbs at birth. Infant had 4 voids and 4 stools. Per parents, infant is still spitty but at lesser amount. Per mom, infant has started latching and is breastfeeding 15 minutes or more  most feedings. LC did not observe latch at this time, infant had breastfed less than 2 hours ago prior to [facility] entering the room. Mom knows to breastfeed  Infant according to hunger cues,  every 3 or less hours and on demand. Per mom, she knows how to hand express and give infant  back volume if she doesn't latch to the breast. Parents have been doing as much STS as possible with infant. Mom knows to call Nurse or [facility] if she has any questions, concerns or need assistance with latching infant to breast.   Feeding Feeding Type: Breast Fed  LATCH Score                   Interventions    Lactation Tools Discussed/Used     Consult Status Consult Status: Follow-up Date: 08/13/19 Follow-up type: In-patient    Vicente Serene 08/13/2019, 12:23 AM

## 2019-08-13 NOTE — Lactation Note (Addendum)
This note was copied from a baby's chart. Lactation Consultation Note  Patient Name: Girl Winni Ehrhard [id number] Date: 08/13/2019 Reason for consult: Follow-up assessment   Baby 70 hours old and baby unlatched upon entering after 20 min. Answered questions about breastfeeding frequency and duration. Reviewed engorgement care and monitoring voids/stools. Feed on demand with cues.  Goal 8-12+ times per day after first 24 hrs.  Place baby STS if not cueing.  Provided mother with manual pump and suggest prepump if having trouble latching.     Maternal Data    Feeding Feeding Type: Breast Fed  LATCH Score                   Interventions Interventions: Breast feeding basics reviewed  Lactation Tools Discussed/Used     Consult Status Consult Status: Follow-up Date: 08/14/19 Follow-up type: In-patient    Vivianne Master [facility] 08/13/2019, 8:48 AM

## 2019-08-13 NOTE — Discharge Summary (Signed)
Obstetric Discharge Summary Reason for Admission: rupture of membranes Prenatal Procedures: ultrasound Intrapartum Procedures: spontaneous vaginal delivery Postpartum Procedures: PPH Complications-Operative and Postpartum: 2nd degree perineal laceration Hemoglobin  Date Value Ref Range Status  08/12/2019 12.5 12.0 - 15.0 g/dL Final   HCT  Date Value Ref Range Status  08/12/2019 37.2 36.0 - 46.0 % Final    Physical Exam:  General: alert and cooperative Lochia: appropriate   Discharge Diagnoses: Term Pregnancy-delivered and [place]  Discharge Information: Date: 08/13/2019 Activity: pelvic rest Diet: routine Medications: PNV, Ibuprofen and Iron Condition: stable Instructions: refer to practice specific booklet Discharge to: home Follow-up Information    Allyn Kenner, DO. Schedule an appointment as soon as possible for a visit in 1 month(s).   Specialty: Obstetrics and Gynecology Contact information: [address] [place] [address] [phone]           Newborn Data: Live born female  Birth Weight: 9 lb 5.2 oz (4230 g) APGAR: 31, 9  Newborn Delivery   Birth date/time: 08/11/2019 22:22:00 Delivery type: Vaginal, Spontaneous      Home with mother.  ANDERSON,MARK E 08/13/2019, 9:47 AM

## 2019-08-13 NOTE — Progress Notes (Signed)
PPD#2 Pt states that she had some chills last pm. Temps all nl. Nl abd or breast pain. May be from breast engorgement.  VSSAF\ IMP/ Stable Plan/Will discharge

## 2019-08-14 LAB — RPR: RPR Ser Ql: NONREACTIVE — AB

## 2019-08-15 ENCOUNTER — Inpatient Hospital Stay (HOSPITAL_COMMUNITY): Payer: Commercial Managed Care - PPO

## 2019-08-15 ENCOUNTER — Inpatient Hospital Stay (HOSPITAL_COMMUNITY)
Admission: AD | Admit: 2019-08-15 | Payer: Commercial Managed Care - PPO | Source: Home / Self Care | Admitting: Obstetrics and Gynecology

## 2019-12-09 IMAGING — US OBSTETRIC <14 WK ULTRASOUND
1 series · 15 of 28 positions shown · non-contrast
Comparison: None for this gestation

CLINICAL DATA: Abdominal pain and vaginal bleeding in first
trimester of pregnancy

EXAM:
OBSTETRIC <14 WK ULTRASOUND
TECHNIQUE: Transabdominal ultrasound was performed for evaluation of the
gestation as well as the maternal uterus and adnexal regions.

[Series 1: obstetric <14 wk ultrasound · 40 acquisitions, 15 frames shown]
[im 1/40]
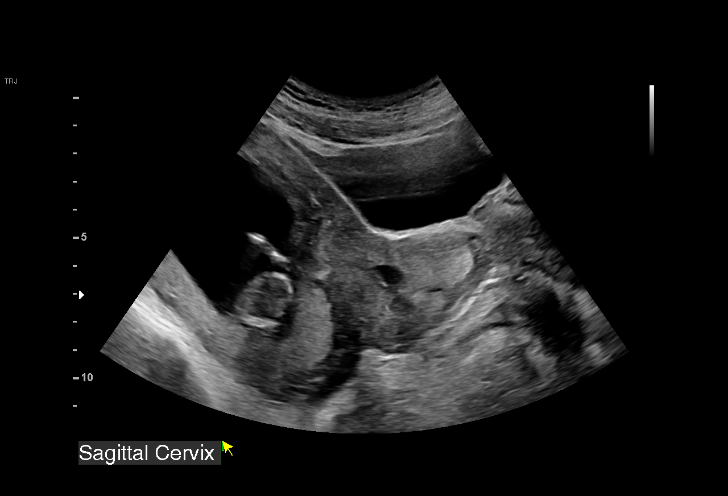
[im 3/40]
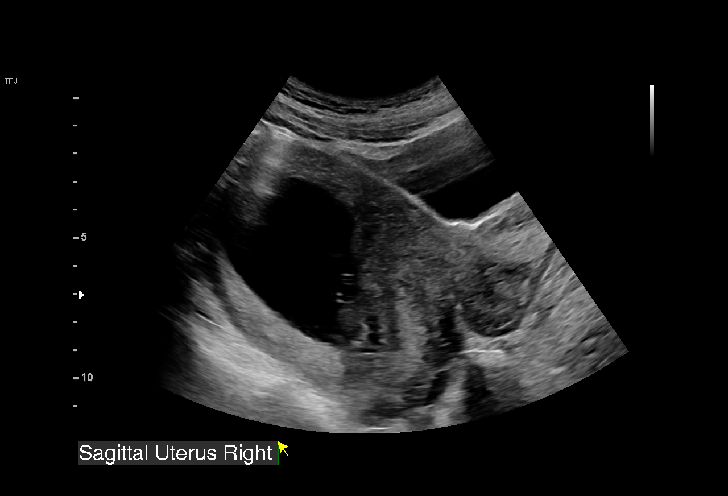
[im 6/40]
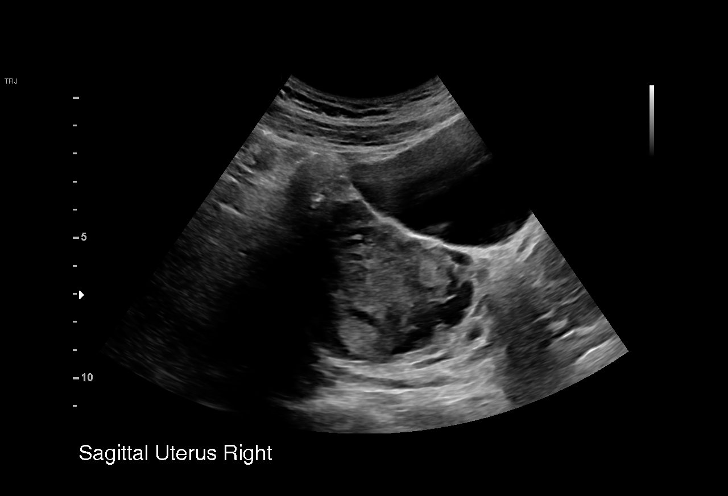
[im 9/40]
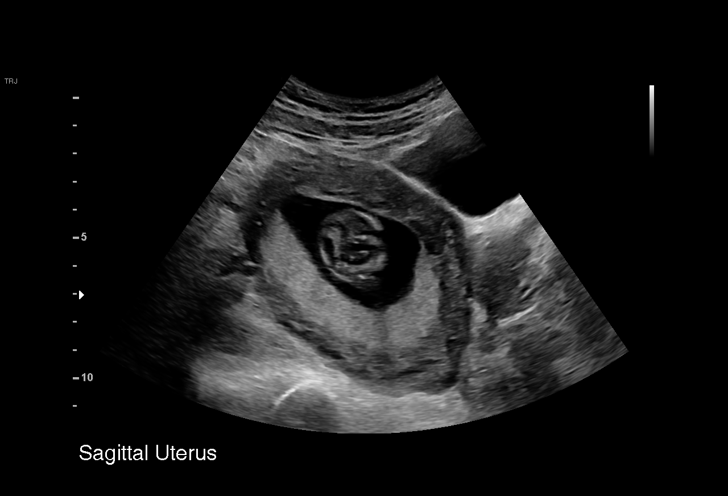
[im 12/40]
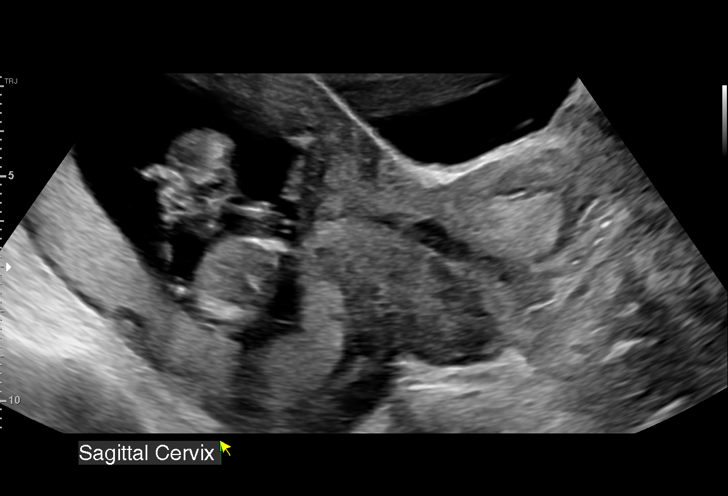
[im 15/40]
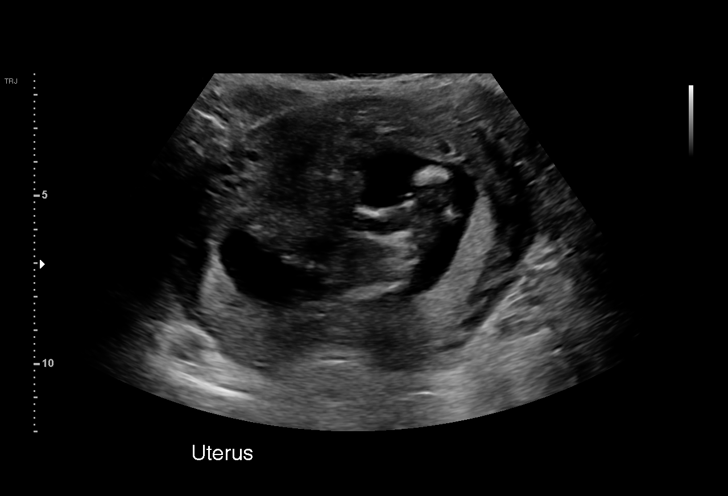
[im 18/40]
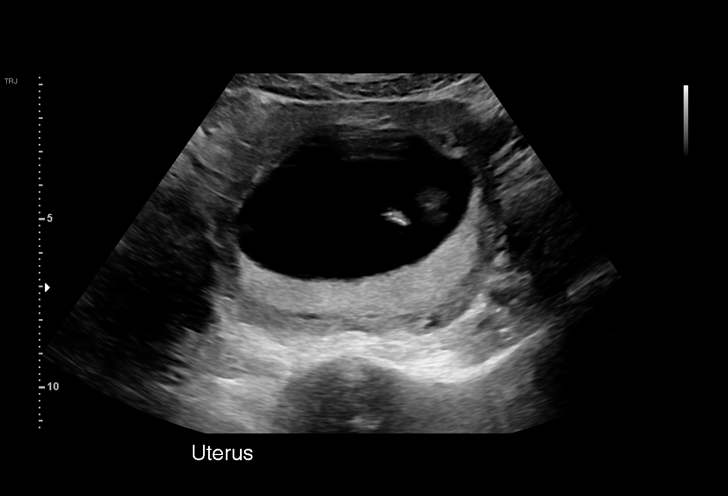
[im 21/40]
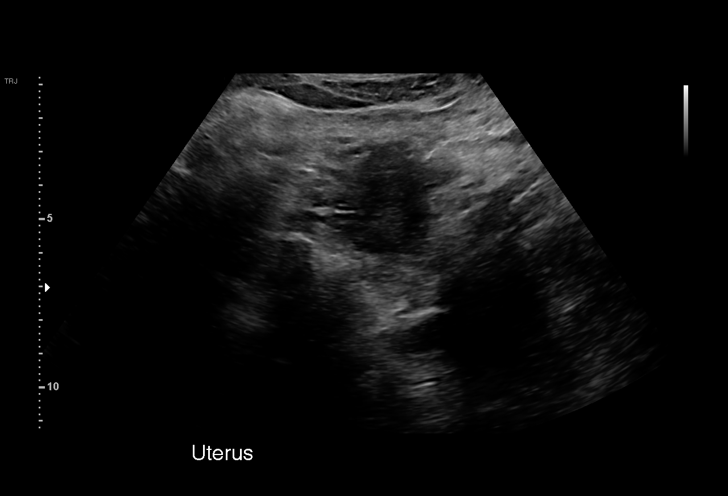
[im 22/40]
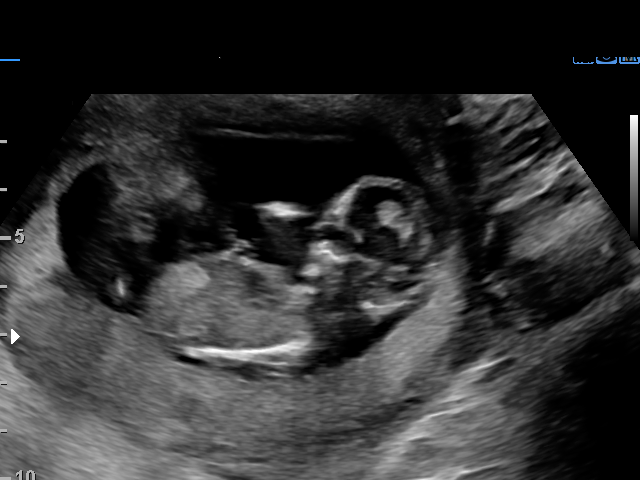
[im 25/40]
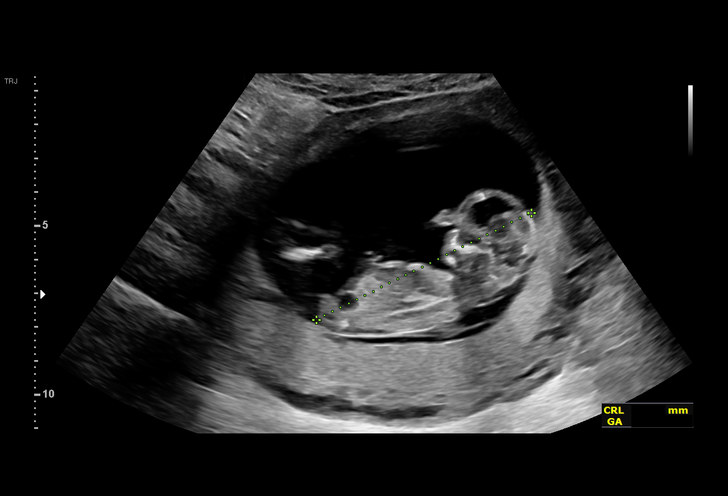
[im 28/40]
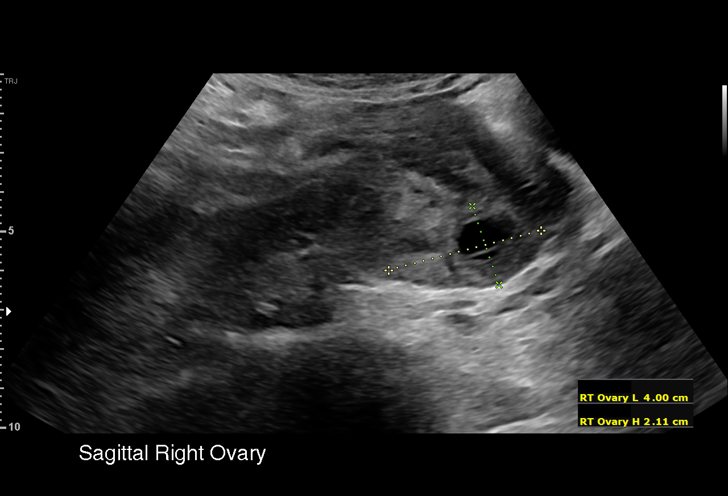
[im 31/40]
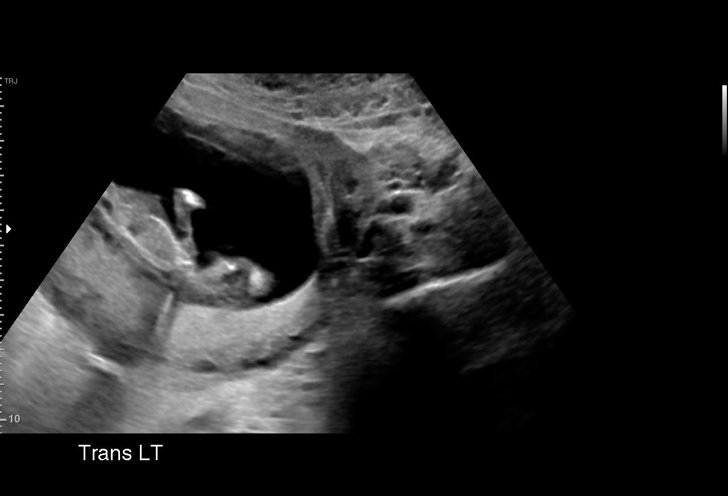
[im 34/40]
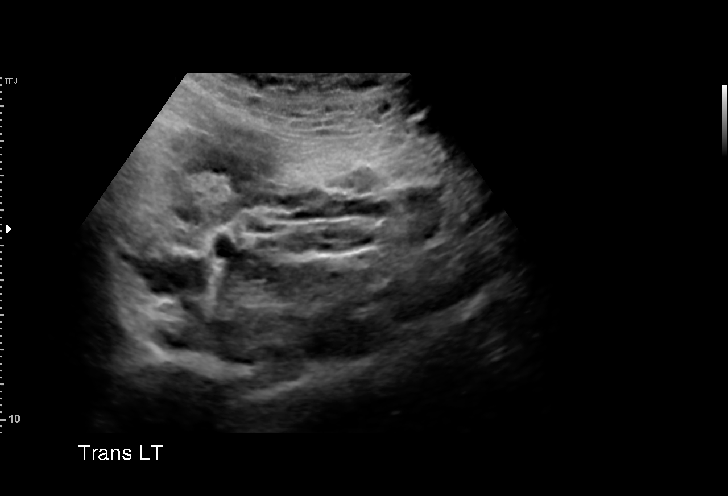
[im 37/40]
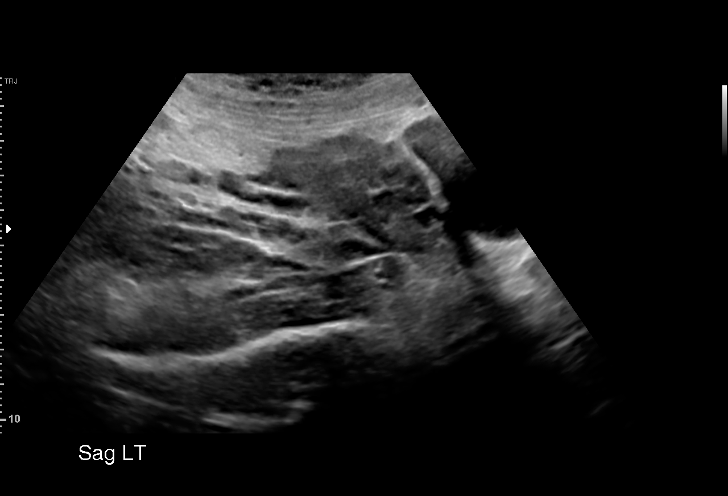
[im 40/40]
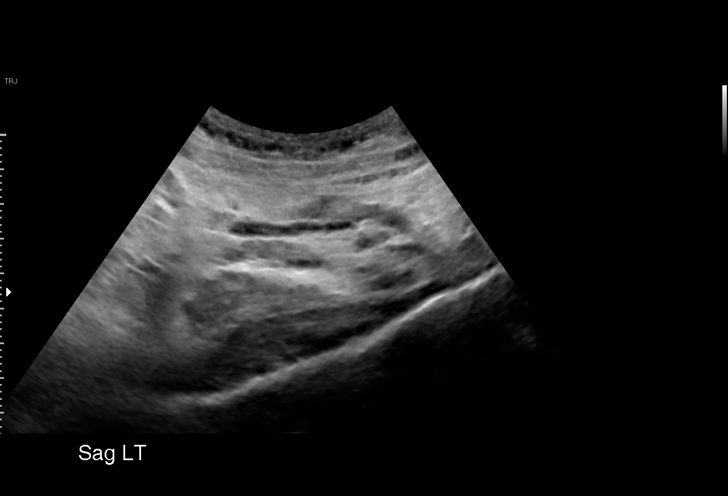

[15 of 28 positions shown; findings below may reference images not displayed]

FINDINGS: Intrauterine gestational sac: Present, single

Yolk sac:  Not visualized

Embryo:  Present

Cardiac Activity: Present

Heart Rate: 169 bpm

CRL:   70.4 mm   13 w 1 d                  US EDC: 08/05/2019

Subchorionic hemorrhage:  None visualized

Maternal uterus/adnexae:

Low lying placenta in very close proximity to internal cervical os
cannot exclude previa, requires attention on follow-up imaging.

RIGHT ovary normal size and morphology 4.0 x 2.1 x 2.3 cm.

LEFT ovary not visualized likely due to a combination of enlarged
pregnant uterus and bowel loops.

No free pelvic fluid or adnexal masses.
IMPRESSION: Single live intrauterine gestation at 13 weeks 1 day EGA by
crown-rump length.

Low lying placenta in very close proximity to internal cervical os
cannot exclude placenta previa; recommend attention on follow-up
imaging.
